# Patient Record
Sex: Female | Born: 1945 | Race: White | Hispanic: No | Marital: Married | Smoking: Former smoker
Health system: Southern US, Community
[De-identification: ages and names within clinical notes are randomized; demographics above are authoritative.]

## PROBLEM LIST (undated history)

## (undated) DIAGNOSIS — I10 Essential (primary) hypertension: Secondary | ICD-10-CM

## (undated) DIAGNOSIS — E785 Hyperlipidemia, unspecified: Secondary | ICD-10-CM

## (undated) HISTORY — PX: MENISCUS REPAIR: SHX5179

## (undated) HISTORY — PX: OTHER SURGICAL HISTORY: SHX169

## (undated) HISTORY — PX: TONSILLECTOMY: SUR1361

## (undated) HISTORY — PX: REPLACEMENT TOTAL KNEE BILATERAL: SUR1225

## (undated) HISTORY — PX: BILATERAL CARPAL TUNNEL RELEASE: SHX6508

## (undated) HISTORY — PX: BUNIONECTOMY: SHX129

---

## 2015-06-28 ENCOUNTER — Encounter: Payer: Self-pay | Admitting: *Deleted

## 2015-06-28 ENCOUNTER — Emergency Department (INDEPENDENT_AMBULATORY_CARE_PROVIDER_SITE_OTHER)
Admission: EM | Admit: 2015-06-28 | Discharge: 2015-06-28 | Disposition: A | Discharge status: Home / Self Care | Payer: Medicare Other | Source: Home / Self Care | Attending: Family Medicine | Admitting: Family Medicine

## 2015-06-28 ENCOUNTER — Emergency Department (INDEPENDENT_AMBULATORY_CARE_PROVIDER_SITE_OTHER): Payer: Medicare Other

## 2015-06-28 ENCOUNTER — Ambulatory Visit (INDEPENDENT_AMBULATORY_CARE_PROVIDER_SITE_OTHER): Payer: Medicare Other | Admitting: Family Medicine

## 2015-06-28 DIAGNOSIS — X58XXXA Exposure to other specified factors, initial encounter: Secondary | ICD-10-CM | POA: Diagnosis not present

## 2015-06-28 DIAGNOSIS — S82301A Unspecified fracture of lower end of right tibia, initial encounter for closed fracture: Secondary | ICD-10-CM | POA: Diagnosis not present

## 2015-06-28 DIAGNOSIS — S82431A Displaced oblique fracture of shaft of right fibula, initial encounter for closed fracture: Secondary | ICD-10-CM

## 2015-06-28 DIAGNOSIS — S82401A Unspecified fracture of shaft of right fibula, initial encounter for closed fracture: Secondary | ICD-10-CM | POA: Insufficient documentation

## 2015-06-28 DIAGNOSIS — S82831A Other fracture of upper and lower end of right fibula, initial encounter for closed fracture: Secondary | ICD-10-CM

## 2015-06-28 HISTORY — DX: Hyperlipidemia, unspecified: E78.5

## 2015-06-28 HISTORY — DX: Essential (primary) hypertension: I10

## 2015-06-28 MED ORDER — TRAMADOL HCL 50 MG PO TABS: 50.0000 mg | ORAL_TABLET | Freq: Four times a day (QID) | ORAL | Status: AC | PRN

## 2015-06-28 NOTE — ED Provider Notes (Signed)
CSN: [id number]     Arrival date & time 06/28/15  8295 History   First MD Initiated Contact with Patient 06/28/15 0914     Chief Complaint  Patient presents with  . Ankle Pain      HPI Comments: Approximately two hours ago patient stepped on a pecan and twisted her right ankle, followed by pain and swelling.  Patient is a 69 y.o. female presenting with ankle pain. The history is provided by the patient.  Ankle Pain Location:  Ankle Time since incident:  2 hours Injury: yes   Mechanism of injury comment:  Inverted ankle Ankle location:  R ankle Pain details:    Quality:  Aching   Radiates to:  Does not radiate   Severity:  Moderate   Onset quality:  Sudden   Duration:  2 hours   Timing:  Constant   Progression:  Unchanged Dislocation: no   Prior injury to area:  Yes Relieved by:  None tried Worsened by:  Bearing weight Ineffective treatments:  None tried Associated symptoms: decreased ROM, stiffness and swelling   Associated symptoms: no back pain, no muscle weakness, no numbness and no tingling   Risk factors: obesity     Past Medical History  Diagnosis Date  . Hypertension   . Hyperlipidemia    Past Surgical History  Procedure Laterality Date  . Tonsillectomy    . Meniscus repair    . Bunionectomy    . Replacement total knee bilateral    . Bilateral carpal tunnel release    . Toe removal     Family History  Problem Relation Age of Onset  . Hearing loss Mother   . Pulmonary fibrosis Father   . Hypertension Father    Social History  Substance Use Topics  . Smoking status: Former Games developer  . Smokeless tobacco: None  . Alcohol Use: No   OB History    No data available     Review of Systems  Musculoskeletal: Positive for stiffness. Negative for back pain.  All other systems reviewed and are negative.   Allergies  Penicillins  Home Medications   Prior to Admission medications   Medication Sig Start Date End Date Taking? Authorizing Provider    levothyroxine (SYNTHROID, LEVOTHROID) 150 MCG tablet Take 150 mcg by mouth daily before breakfast.   Yes Historical Provider, MD  losartan (COZAAR) 100 MG tablet Take 100 mg by mouth daily.   Yes Historical Provider, MD  meloxicam (MOBIC) 15 MG tablet Take 15 mg by mouth daily.   Yes Historical Provider, MD  metoprolol succinate (TOPROL-XL) 50 MG 24 hr tablet Take 50 mg by mouth daily. Take with or immediately following a meal.   Yes Historical Provider, MD  traMADol (ULTRAM) 50 MG tablet Take by mouth every 6 (six) hours as needed.   Yes Historical Provider, MD  triamterene (DYRENIUM) 100 MG capsule Take 100 mg by mouth 2 (two) times daily.   Yes Historical Provider, MD  traMADol (ULTRAM) 50 MG tablet Take 1 tablet (50 mg total) by mouth every 6 (six) hours as needed. 06/28/15   Rodolph Bong, MD   Meds Ordered and Administered this Visit  Medications - No data to display  BP 157/81 mmHg  Pulse 78  Temp(Src) 97.8 F (36.6 C) (Oral)  Resp 16  Ht 5\' 6"  (1.676 m)  Wt 250 lb (113.399 kg)  BMI 40.37 kg/m2  SpO2 97% No data found.   Physical Exam  Constitutional: She is oriented to  person, place, and time. She appears well-developed and well-nourished. No distress.  Patient is obese (BMI 40.4)  HENT:  Head: Atraumatic.  Eyes: Conjunctivae are normal. Pupils are equal, round, and reactive to light.  Neck: Normal range of motion.  Cardiovascular: Normal heart sounds.   Pulmonary/Chest: Breath sounds normal.  Abdominal: There is no tenderness.  Musculoskeletal:       Right ankle: She exhibits decreased range of motion, swelling and ecchymosis. She exhibits no deformity, no laceration and normal pulse. Tenderness. Lateral malleolus and medial malleolus tenderness found. No head of 5th metatarsal and no proximal fibula tenderness found. Achilles tendon normal.       Feet:  Right ankle:  Decreased range of motion.  Tenderness and swelling over both the lateral and medial malleolus (most  prominent on the lateral malleolus, extending about 10cm superiorly).  Joint stable.  No tenderness over the base of the fifth metatarsal.  Distal neurovascular function is intact.   Neurological: She is alert and oriented to person, place, and time.  Skin: Skin is warm and dry.  Nursing note and vitals reviewed.   ED Course  Procedures  None    Imaging Results       DG Ankle Complete Right (Final result) Result time: 06/28/15 10:09:03   Final result by Rad Results In Interface (06/28/15 10:09:03)   Narrative:   CLINICAL DATA: Slip and fall with twisting injury of the right ankle. Initial encounter.  EXAM: RIGHT ANKLE - COMPLETE 3+ VIEW  COMPARISON: None.  FINDINGS: Acute oblique fracture of the distal fibula noted with minimal displacement. Overlying soft tissue swelling is identified. There is no definite fracture involving the medial malleolus or posterior malleolus. The ankle mortise shows normal alignment.  IMPRESSION: Acute, oblique fracture of the distal fibula without significant displacement.   Electronically Signed By: Irish Lack M.D. On: 06/28/2015 10:09       MDM   1. Traumatic closed nondisplaced fracture of distal fibula, right, initial encounter    Patient referred to Dr. Clementeen Graham (sports medicine) for definitive treatment and management    Lattie Haw, MD 07/06/15 [phone]

## 2015-06-28 NOTE — Assessment & Plan Note (Signed)
Not significantly displaced. Posterior leg splint applied. Walker and tramadol prescribed. Follow-up with orthopedics.

## 2015-06-28 NOTE — Progress Notes (Signed)
   Subjective:    I'm seeing this patient as a consultation for:  Dr. Cathren Harsh  CC: right fibula fracture  HPI: patient tripped this morning injuring her right ankle. She notes significant pain with weightbearing. She has not tried any treatment tried yet.  Past medical history, Surgical history, Family history not pertinant except as noted below, Social history, Allergies, and medications have been entered into the medical record, reviewed, and no changes needed.   Review of Systems: No headache, visual changes, nausea, vomiting, diarrhea, constipation, dizziness, abdominal pain, skin rash, fevers, chills, night sweats, weight loss, swollen lymph nodes, body aches, joint swelling, muscle aches, chest pain, shortness of breath, mood changes, visual or auditory hallucinations.   Objective:   There were no vitals filed for this visit. General: Well Developed, well nourished, and in no acute distress.  Neuro/Psych: Alert and oriented x3, extra-ocular muscles intact, able to move all 4 extremities, sensation grossly intact. Skin: Warm and dry, no rashes noted.  Respiratory: Not using accessory muscles, speaking in full sentences, trachea midline.  Cardiovascular: Pulses palpable, no extremity edema. Abdomen: Does not appear distended. MSK: Right ankle swollen and ttp lat malleolus. Normal pulses distally. 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   No results found for this or any previous visit (from the past 24 hour(s)). Dg Ankle Complete Right  06/28/2015   CLINICAL DATA:  Slip and fall with twisting injury of the right ankle. Initial encounter.  EXAM: RIGHT ANKLE - COMPLETE 3+ VIEW  COMPARISON:  None.  FINDINGS: Acute oblique fracture of the distal fibula noted with minimal displacement. Overlying soft tissue swelling is identified. There is no definite fracture involving the medial malleolus or posterior malleolus. The ankle mortise shows normal alignment.  IMPRESSION: Acute, oblique fracture  of the distal fibula without significant displacement.   Electronically Signed   By: Irish Lack M.D.   On: 06/28/2015 10:09    A posterior leg splint was strips was applied  Impression and Recommendations:   This case required medical decision making of moderate complexity.

## 2015-06-28 NOTE — ED Notes (Signed)
Pt c/o RT ankle injury after twisting it at 0730 today. She reports stepping on a pecan nut on the ground and rolling her ankle.

## 2015-06-28 NOTE — Patient Instructions (Signed)
Thank you for coming in today. Use tramdol for pain as needed.  Come back if you have a problem.  Follow up with orthopedics soon.  Do not walk on the splint.  Cast or Splint Care Casts and splints support injured limbs and keep bones from moving while they heal. It is important to care for your cast or splint at home.  HOME CARE INSTRUCTIONS  Keep the cast or splint uncovered during the drying period. It can take 24 to 48 hours to dry if it is made of plaster. A fiberglass cast will dry in less than 1 hour.  Do not rest the cast on anything harder than a pillow for the first 24 hours.  Do not put weight on your injured limb or apply pressure to the cast until your health care provider gives you permission.  Keep the cast or splint dry. Wet casts or splints can lose their shape and may not support the limb as well. A wet cast that has lost its shape can also create harmful pressure on your skin when it dries. Also, wet skin can become infected.  Cover the cast or splint with a plastic bag when bathing or when out in the rain or snow. If the cast is on the trunk of the body, take sponge baths until the cast is removed.  If your cast does become wet, dry it with a towel or a blow dryer on the cool setting only.  Keep your cast or splint clean. Soiled casts may be wiped with a moistened cloth.  Do not place any hard or soft foreign objects under your cast or splint, such as cotton, toilet paper, lotion, or powder.  Do not try to scratch the skin under the cast with any object. The object could get stuck inside the cast. Also, scratching could lead to an infection. If itching is a problem, use a blow dryer on a cool setting to relieve discomfort.  Do not trim or cut your cast or remove padding from inside of it.  Exercise all joints next to the injury that are not immobilized by the cast or splint. For example, if you have a long leg cast, exercise the hip joint and toes. If you have an arm  cast or splint, exercise the shoulder, elbow, thumb, and fingers.  Elevate your injured arm or leg on 1 or 2 pillows for the first 1 to 3 days to decrease swelling and pain.It is best if you can comfortably elevate your cast so it is higher than your heart. SEEK MEDICAL CARE IF:   Your cast or splint cracks.  Your cast or splint is too tight or too loose.  You have unbearable itching inside the cast.  Your cast becomes wet or develops a soft spot or area.  You have a bad smell coming from inside your cast.  You get an object stuck under your cast.  Your skin around the cast becomes red or raw.  You have new pain or worsening pain after the cast has been applied. SEEK IMMEDIATE MEDICAL CARE IF:   You have fluid leaking through the cast.  You are unable to move your fingers or toes.  You have discolored (blue or white), cool, painful, or very swollen fingers or toes beyond the cast.  You have tingling or numbness around the injured area.  You have severe pain or pressure under the cast.  You have any difficulty with your breathing or have shortness of breath.  You have chest pain. Document Released: 09/29/2000 Document Revised: 07/23/2013 Document Reviewed: 04/10/2013 [facility] Patient Information 2015 [place], [place]. This information is not intended to replace advice given to you by your health care provider. Make sure you discuss any questions you have with your health care provider.

## 2015-07-06 NOTE — Discharge Instructions (Signed)
Ankle Fracture  A fracture is a break in a bone. The ankle joint is made up of three bones. These include the lower (distal)sections of your lower leg bones, called the tibia and fibula, along with a bone in your foot, called the talus. Depending on how bad the break is and if more than one ankle joint bone is broken, a cast or splint is used to protect and keep your injured bone from moving while it heals. Sometimes, surgery is required to help the fracture heal properly.   There are two general types of fractures:   Stable fracture. This includes a single fracture line through one bone, with no injury to ankle ligaments. A fracture of the talus that does not have any displacement (movement of the bone on either side of the fracture line) is also stable.   Unstable fracture. This includes more than one fracture line through one or more bones in the ankle joint. It also includes fractures that have displacement of the bone on either side of the fracture line.  CAUSES   A direct blow to the ankle.    Quickly and severely twisting your ankle.   Trauma, such as a car accident or falling from a significant height.  RISK FACTORS  You may be at a higher risk of ankle fracture if:   You have certain medical conditions.   You are involved in high-impact sports.   You are involved in a high-impact car accident.  SIGNS AND SYMPTOMS    Tender and swollen ankle.   Bruising around the injured ankle.   Pain on movement of the ankle.   Difficulty walking or putting weight on the ankle.   A cold foot below the site of the ankle injury. This can occur if the blood vessels passing through your injured ankle were also damaged.   Numbness in the foot below the site of the ankle injury.  DIAGNOSIS   An ankle fracture is usually diagnosed with a physical exam and X-rays. A CT scan may also be required for complex fractures.  TREATMENT   Stable fractures are treated with a cast or splint and using crutches to avoid putting  weight on your injured ankle. This is followed by an ankle strengthening program. Some patients require a special type of cast, depending on other medical problems they may have. Unstable fractures require surgery to ensure the bones heal properly. Your health care provider will tell you what type of fracture you have and the best treatment for your condition.  HOME CARE INSTRUCTIONS    Review correct crutch use with your health care provider and use your crutches as directed. Safe use of crutches is extremely important. Misuse of crutches can cause you to fall or cause injury to nerves in your hands or armpits.   Do not put weight or pressure on the injured ankle until directed by your health care provider.   To lessen the swelling, keep the injured leg elevated while sitting or lying down.   Apply ice to the injured area:   Put ice in a plastic bag.   Place a towel between your cast and the bag.   Leave the ice on for 20 minutes, 2-3 times a day.   If you have a plaster or fiberglass cast:   Do not try to scratch the skin under the cast with any objects. This can increase your risk of skin infection.   Check the skin around the cast every day. You   may put lotion on any red or sore areas.   Keep your cast dry and clean.   If you have a plaster splint:   Wear the splint as directed.   You may loosen the elastic around the splint if your toes become numb, tingle, or turn cold or blue.   Do not put pressure on any part of your cast or splint; it may break. Rest your cast only on a pillow the first 24 hours until it is fully hardened.   Your cast or splint can be protected during bathing with a plastic bag sealed to your skin with medical tape. Do not lower the cast or splint into water.   Take medicines as directed by your health care provider. Only take over-the-counter or prescription medicines for pain, discomfort, or fever as directed by your health care provider.   Do not drive a vehicle until  your health care provider specifically tells you it is safe to do so.   If your health care provider has given you a follow-up appointment, it is very important to keep that appointment. Not keeping the appointment could result in a chronic or permanent injury, pain, and disability. If you have any problem keeping the appointment, call the facility for assistance.  SEEK MEDICAL CARE IF:  You develop increased swelling or discomfort.  SEEK IMMEDIATE MEDICAL CARE IF:    Your cast gets damaged or breaks.   You have continued severe pain.   You develop new pain or swelling after the cast was put on.   Your skin or toenails below the injury turn blue or gray.   Your skin or toenails below the injury feel cold, numb, or have loss of sensitivity to touch.   There is a bad smell or pus draining from under the cast.  MAKE SURE YOU:    Understand these instructions.   Will watch your condition.   Will get help right away if you are not doing well or get worse.  Document Released: 09/29/2000 Document Revised: 10/07/2013 Document Reviewed: 05/01/2013  [employer] Patient Information 2015 [employer]. This information is not intended to replace advice given to you by your health care provider. Make sure you discuss any questions you have with your health care provider.

## 2016-10-10 IMAGING — CR DG ANKLE COMPLETE 3+V*R*
3 series · 3 of 3 positions shown · non-contrast
Comparison: None.

CLINICAL DATA: Slip and fall with twisting injury of the right
ankle. Initial encounter.

EXAM:
RIGHT ANKLE - COMPLETE 3+ VIEW

[ankle ap]
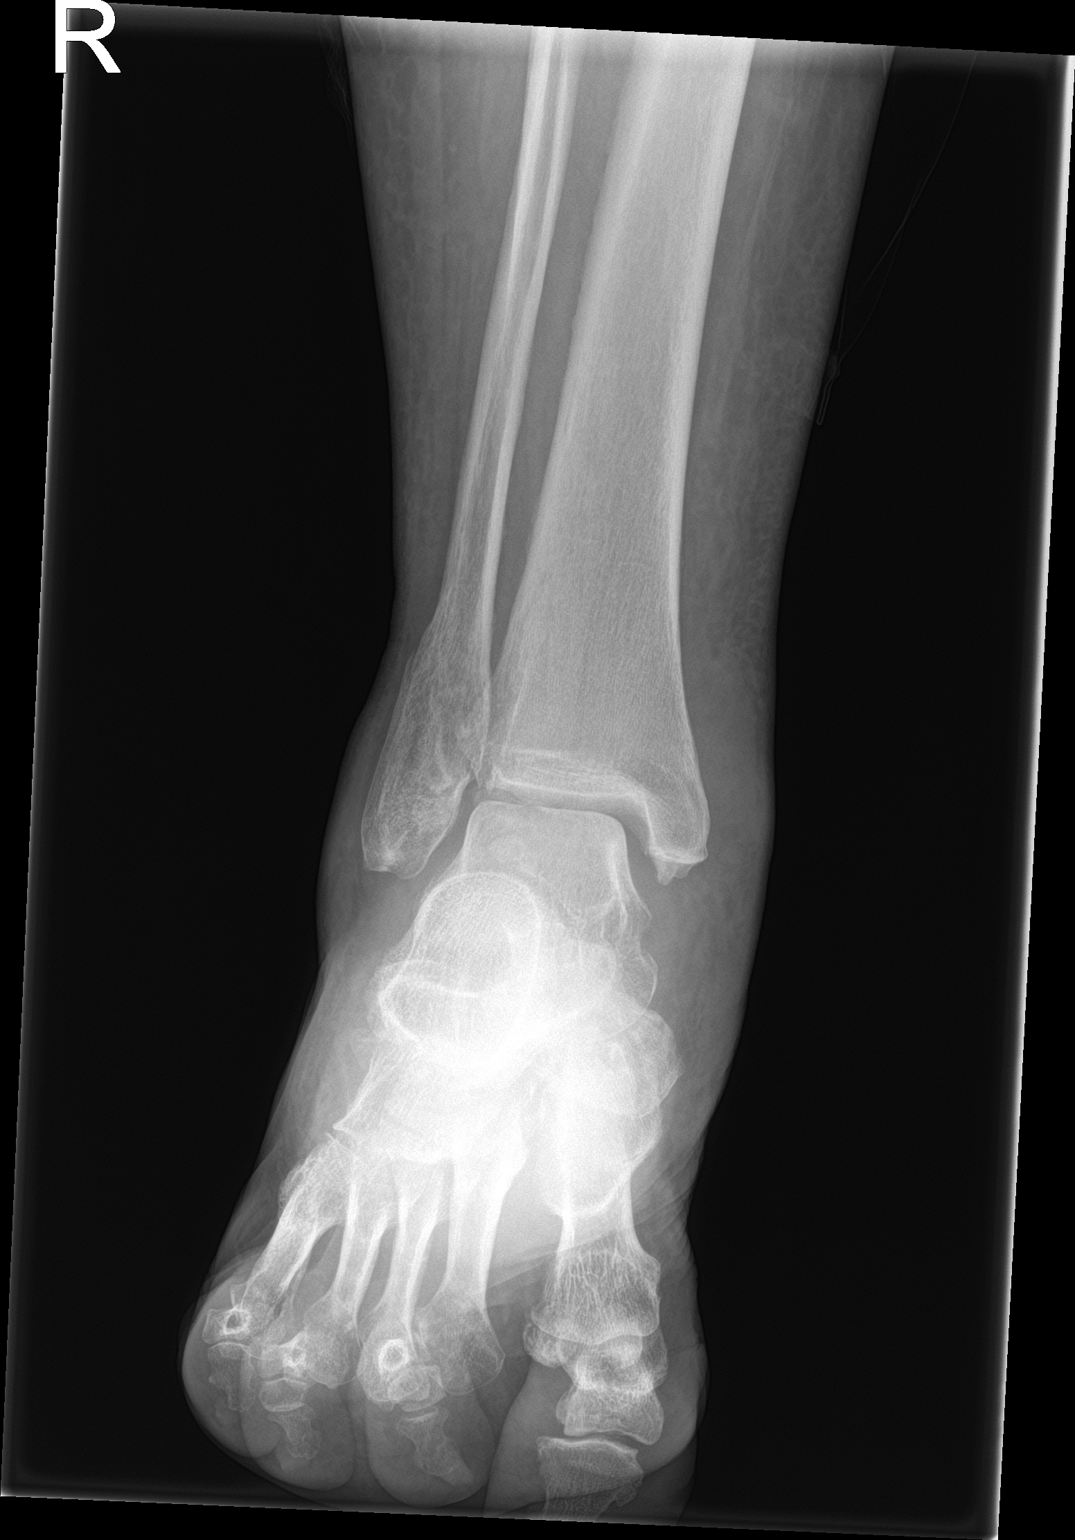

[ankle obl]
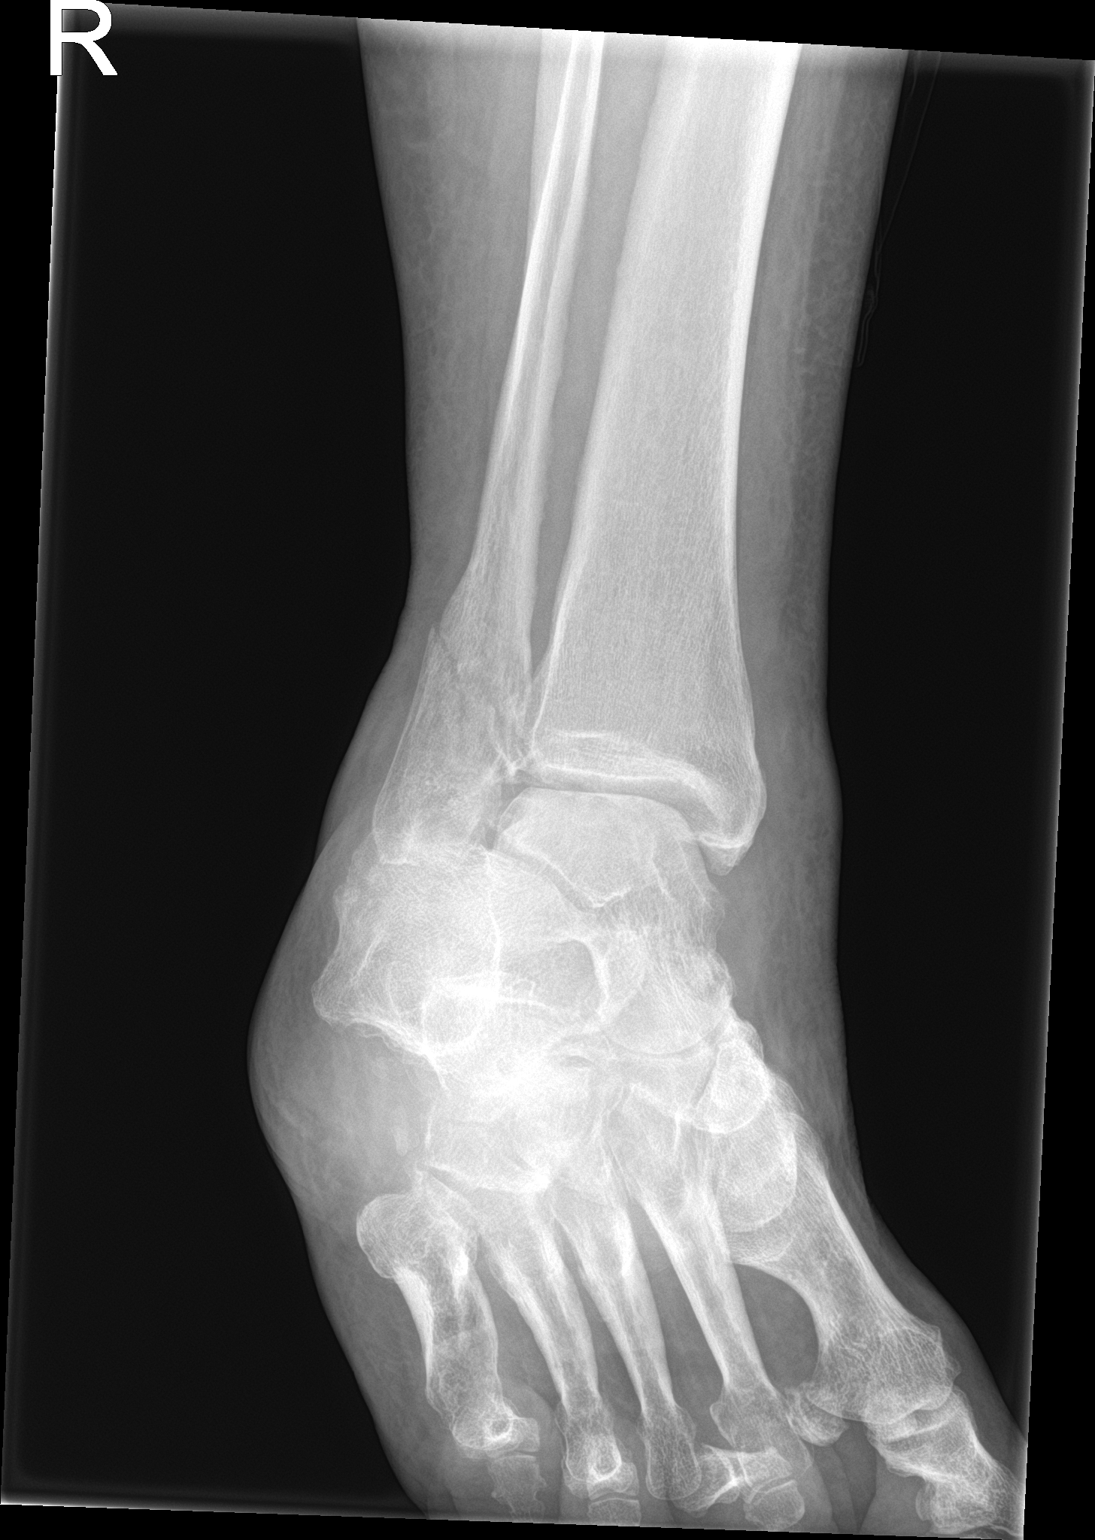

[ankle lat]
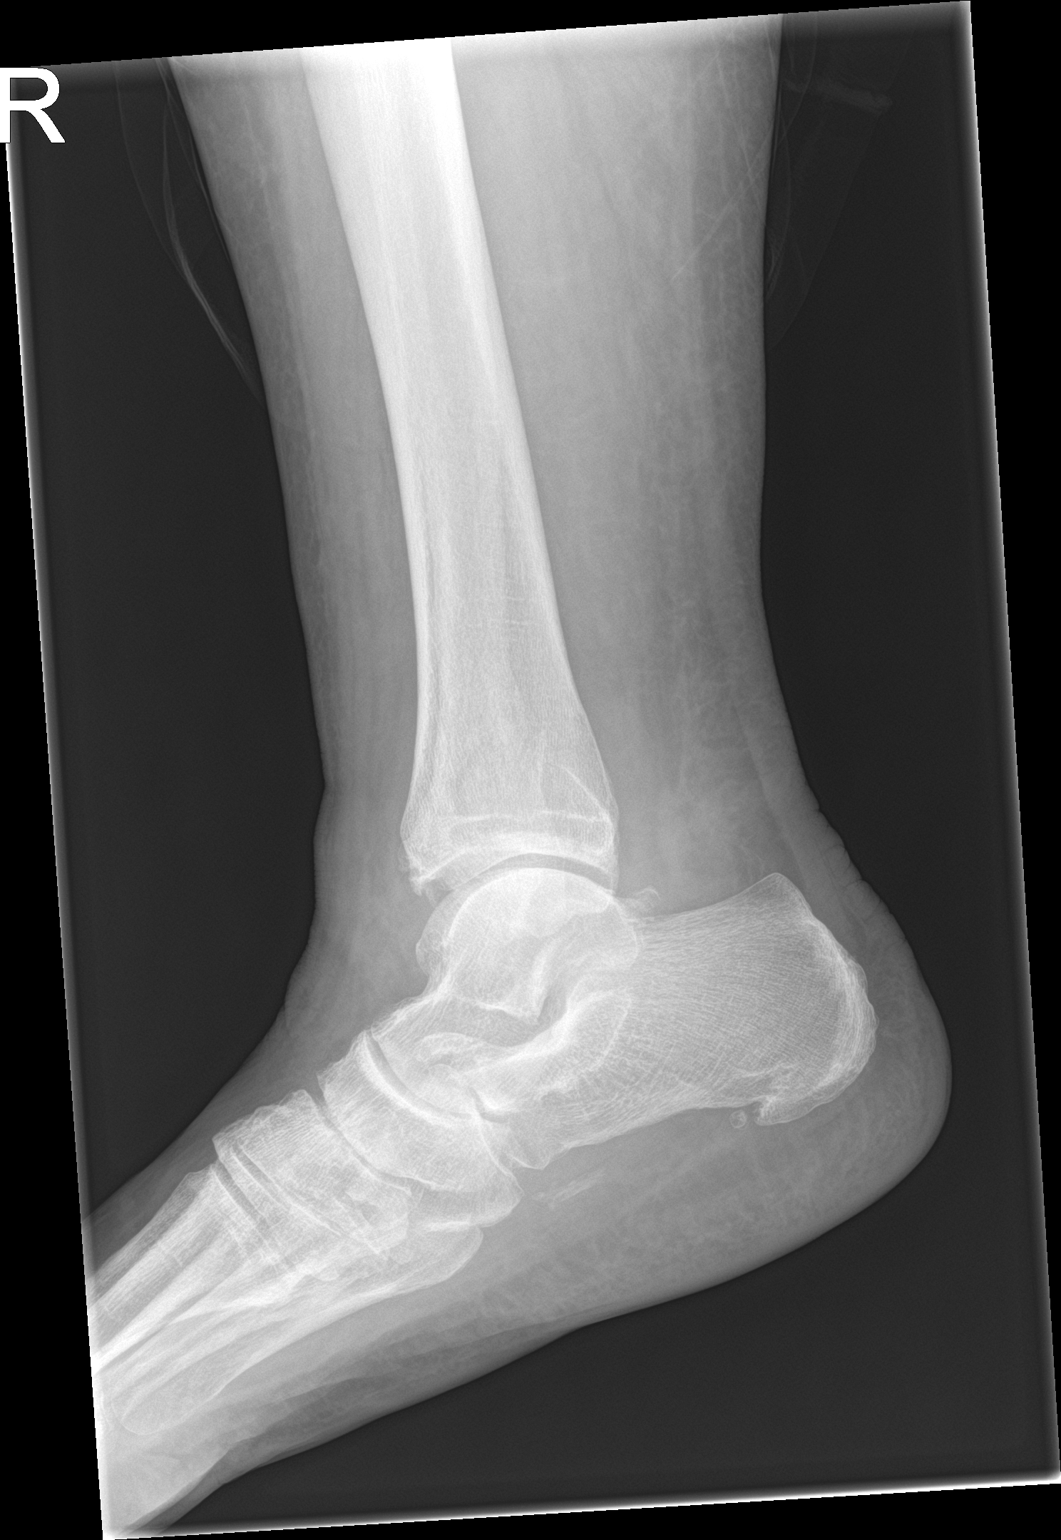

[3 of 3 positions shown; findings below may reference images not displayed]

FINDINGS: Acute oblique fracture of the distal fibula noted with minimal
displacement. Overlying soft tissue swelling is identified. There is
no definite fracture involving the medial malleolus or posterior
malleolus. The ankle mortise shows normal alignment.
IMPRESSION: Acute, oblique fracture of the distal fibula without significant
displacement.
